# Patient Record
Sex: Female | Born: 1985 | Race: White | Hispanic: No | Marital: Married | Smoking: Never smoker
Health system: Southern US, Community
[De-identification: ages and names within clinical notes are randomized; demographics above are authoritative.]

---

## 2014-11-12 ENCOUNTER — Emergency Department (HOSPITAL_BASED_OUTPATIENT_CLINIC_OR_DEPARTMENT_OTHER): Payer: BLUE CROSS/BLUE SHIELD

## 2014-11-12 ENCOUNTER — Encounter (HOSPITAL_BASED_OUTPATIENT_CLINIC_OR_DEPARTMENT_OTHER): Payer: Self-pay | Admitting: Emergency Medicine

## 2014-11-12 ENCOUNTER — Emergency Department (HOSPITAL_BASED_OUTPATIENT_CLINIC_OR_DEPARTMENT_OTHER)
Admission: EM | Admit: 2014-11-12 | Discharge: 2014-11-12 | Disposition: A | Discharge status: Home / Self Care | Payer: BLUE CROSS/BLUE SHIELD | Attending: Emergency Medicine | Admitting: Emergency Medicine

## 2014-11-12 DIAGNOSIS — IMO0002 Reserved for concepts with insufficient information to code with codable children: Secondary | ICD-10-CM

## 2014-11-12 DIAGNOSIS — W260XXA Contact with knife, initial encounter: Secondary | ICD-10-CM | POA: Diagnosis not present

## 2014-11-12 DIAGNOSIS — Y93G3 Activity, cooking and baking: Secondary | ICD-10-CM | POA: Insufficient documentation

## 2014-11-12 DIAGNOSIS — Y998 Other external cause status: Secondary | ICD-10-CM | POA: Insufficient documentation

## 2014-11-12 DIAGNOSIS — Y9289 Other specified places as the place of occurrence of the external cause: Secondary | ICD-10-CM | POA: Insufficient documentation

## 2014-11-12 DIAGNOSIS — S61012A Laceration without foreign body of left thumb without damage to nail, initial encounter: Secondary | ICD-10-CM | POA: Diagnosis present

## 2014-11-12 DIAGNOSIS — Z23 Encounter for immunization: Secondary | ICD-10-CM | POA: Insufficient documentation

## 2014-11-12 MED ORDER — LIDOCAINE HCL (PF) 1 % IJ SOLN
5.0000 mL | Freq: Once | INTRAMUSCULAR | Status: AC
Start: 2014-11-12 — End: 2014-11-12
  Administered 2014-11-12: 5 mL
  Filled 2014-11-12: qty 5

## 2014-11-12 MED ORDER — HYDROCODONE-ACETAMINOPHEN 5-325 MG PO TABS
1.0000 | ORAL_TABLET | Freq: Once | ORAL | Status: AC
Start: 2014-11-12 — End: 2014-11-12
  Administered 2014-11-12: 1 via ORAL
  Filled 2014-11-12: qty 1

## 2014-11-12 MED ORDER — TETANUS-DIPHTH-ACELL PERTUSSIS 5-2.5-18.5 LF-MCG/0.5 IM SUSP
0.5000 mL | Freq: Once | INTRAMUSCULAR | Status: AC
  Administered 2014-11-12: 0.5 mL via INTRAMUSCULAR
  Filled 2014-11-12: qty 0.5

## 2014-11-12 NOTE — Discharge Instructions (Signed)

## 2014-11-12 NOTE — ED Notes (Addendum)
Pt received new knives for wedding present, first time using them and sliced left  thumb while cooking dinner

## 2014-11-12 NOTE — ED Provider Notes (Signed)
CSN: [id number]     Arrival date & time 11/12/14  2002 History   First MD Initiated Contact with Patient 11/12/14 2018     Chief Complaint  Patient presents with  . Laceration     (Consider location/radiation/quality/duration/timing/severity/associated sxs/prior Treatment) Patient is a 29 y.o. female presenting with skin laceration. The history is provided by the patient. No language interpreter was used.  Laceration Location:  Hand Hand laceration location:  L finger Laceration mechanism:  Knife Pain details:    Quality:  Aching   Severity:  Moderate   Timing:  Constant   Progression:  Unchanged Foreign body present:  No foreign bodies Worsened by:  Nothing tried Tetanus status:  Out of date   History reviewed. No pertinent past medical history. History reviewed. No pertinent past surgical history. History reviewed. No pertinent family history. History  Substance Use Topics  . Smoking status: Never Smoker   . Smokeless tobacco: Not on file  . Alcohol Use: No   OB History    No data available     Review of Systems  All other systems reviewed and are negative.     Allergies  Review of patient's allergies indicates no known allergies.  Home Medications   Prior to Admission medications   Not on File   BP 131/91 mmHg  Pulse 84  Temp(Src) 98.5 F (36.9 C) (Oral)  Resp 18  Ht 5\' 4"  (1.626 m)  Wt 135 lb (61.236 kg)  BMI 23.16 kg/m2  SpO2 100% Physical Exam  Constitutional: She is oriented to person, place, and time. She appears well-developed and well-nourished.  Cardiovascular: Normal rate and regular rhythm.   Pulmonary/Chest: Effort normal and breath sounds normal.  Musculoskeletal: Normal range of motion.  Neurological: She is alert and oriented to person, place, and time. Coordination normal.  Skin:  Laceration to the left thumb partially thru the nail  Nursing note and vitals reviewed.   ED Course  LACERATION REPAIR Date/Time: 11/12/2014 9:48  PM Performed by: Teressa LowerPICKERING, Jesiah Yerby Authorized by: Teressa LowerPICKERING, Verline Kong Consent: Verbal consent obtained. Consent given by: patient Patient identity confirmed: verbally with patient Time out: Immediately prior to procedure a "time out" was called to verify the correct patient, procedure, equipment, support staff and site/side marked as required. Body area: upper extremity Location details: left thumb Laceration length: 1 cm Foreign bodies: no foreign bodies Anesthesia: local infiltration Local anesthetic: lidocaine 1% without epinephrine Irrigation solution: saline and tap water Amount of cleaning: standard Skin closure: 4-0 Prolene Number of sutures: 4 Technique: simple Approximation: close Approximation difficulty: simple Patient tolerance: Patient tolerated the procedure well with no immediate complications   (including critical care time) Labs Review Labs Reviewed - No data to display  Imaging Review Dg Finger Thumb Left  11/12/2014   CLINICAL DATA:  Laceration tip of LEFT thumb this evening.  EXAM: LEFT THUMB 2+V  COMPARISON:  None.  FINDINGS: There is no evidence of fracture or dislocation. There is no evidence of arthropathy or other focal bone abnormality. Distal phalanx soft tissue defect without subcutaneous gas or radiopaque foreign bodies.  IMPRESSION: Distal phalanx suspected soft tissue laceration without acute osseous process.   Electronically Signed   By: Awilda Metroourtnay  Bloomer   On: 11/12/2014 21:05     EKG Interpretation None      MDM   Final diagnoses:  Laceration  Thumb laceration, left, initial encounter    Wound closed without any problem. Tetanus updated. Discussed follow up with DR. Amanda Peagramig for possible nail  revision    Teressa Lower, NP 11/12/14 1610  Rolland Porter, MD 11/16/14 [phone]

## 2014-12-19 ENCOUNTER — Other Ambulatory Visit: Payer: Self-pay | Admitting: Obstetrics & Gynecology

## 2014-12-19 ENCOUNTER — Other Ambulatory Visit (HOSPITAL_COMMUNITY)
Admission: RE | Admit: 2014-12-19 | Discharge: 2014-12-19 | Disposition: A | Discharge status: Home / Self Care | Payer: BLUE CROSS/BLUE SHIELD | Source: Ambulatory Visit | Attending: Obstetrics and Gynecology | Admitting: Obstetrics and Gynecology

## 2014-12-19 DIAGNOSIS — Z01411 Encounter for gynecological examination (general) (routine) with abnormal findings: Secondary | ICD-10-CM | POA: Insufficient documentation

## 2014-12-20 LAB — CYTOLOGY - PAP

## 2016-10-11 IMAGING — CR DG FINGER THUMB 2+V*L*
3 series · 3 of 3 positions shown · non-contrast
Comparison: None.

CLINICAL DATA: Laceration tip of LEFT thumb this evening.

EXAM:
LEFT THUMB 2+V

[x finger pa left]
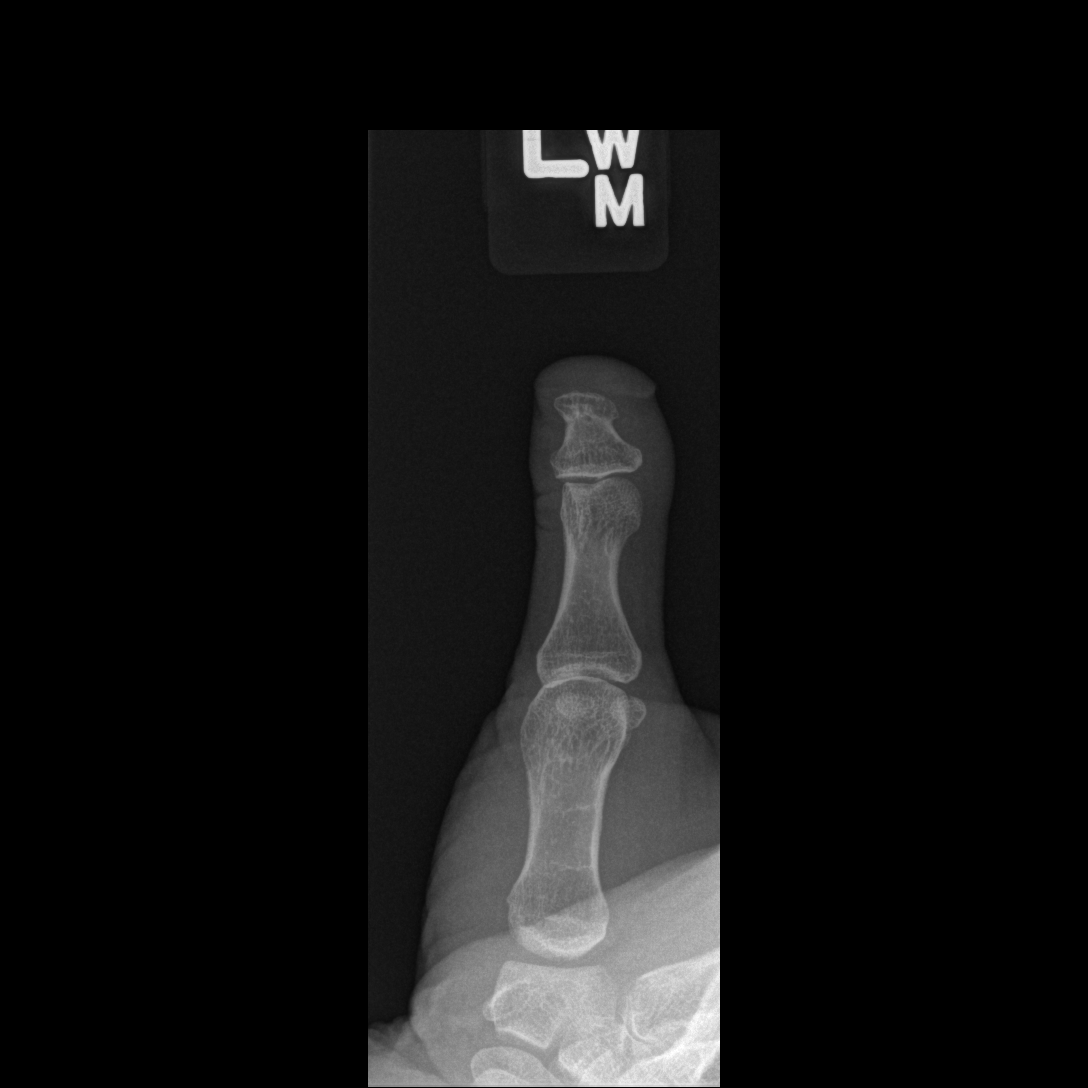

[x finger obl. left]
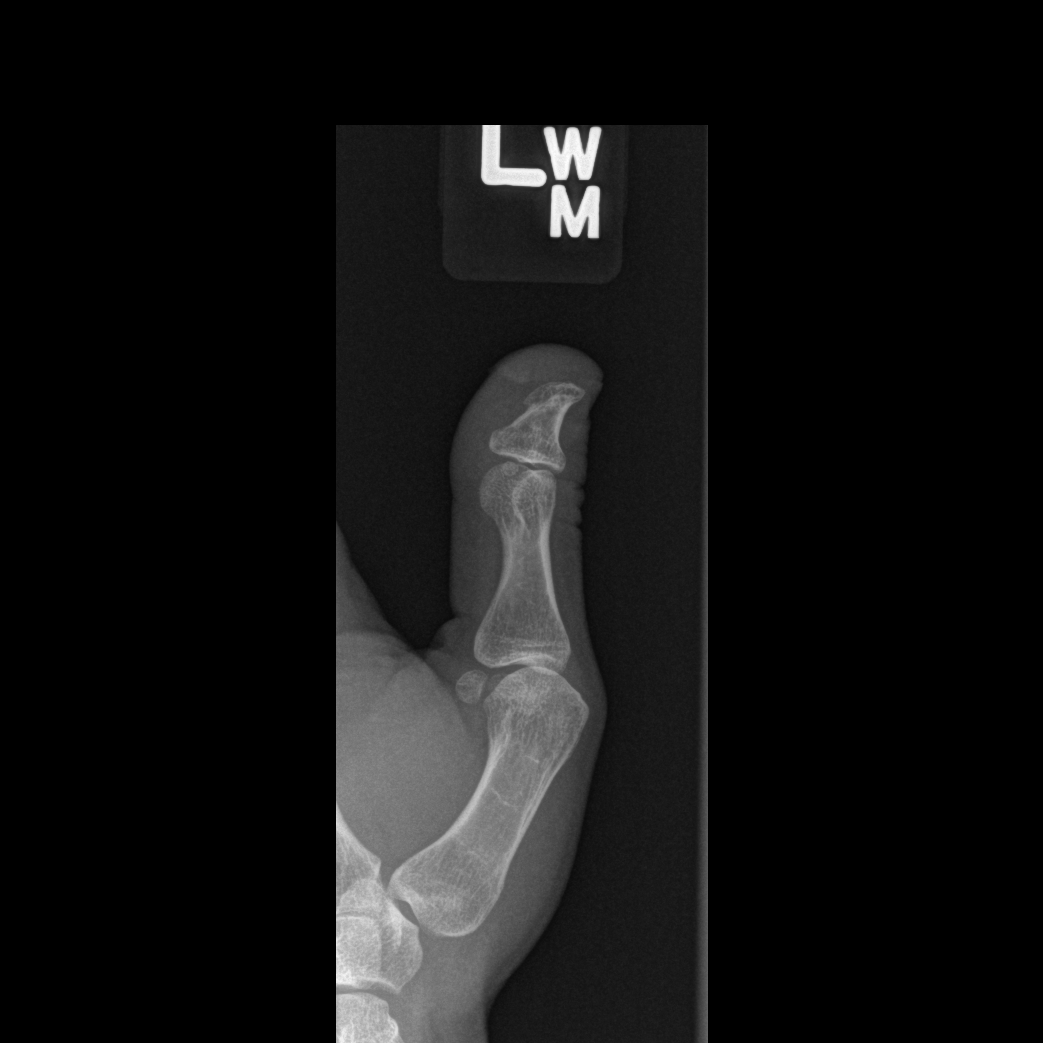

[x finger lateral left]
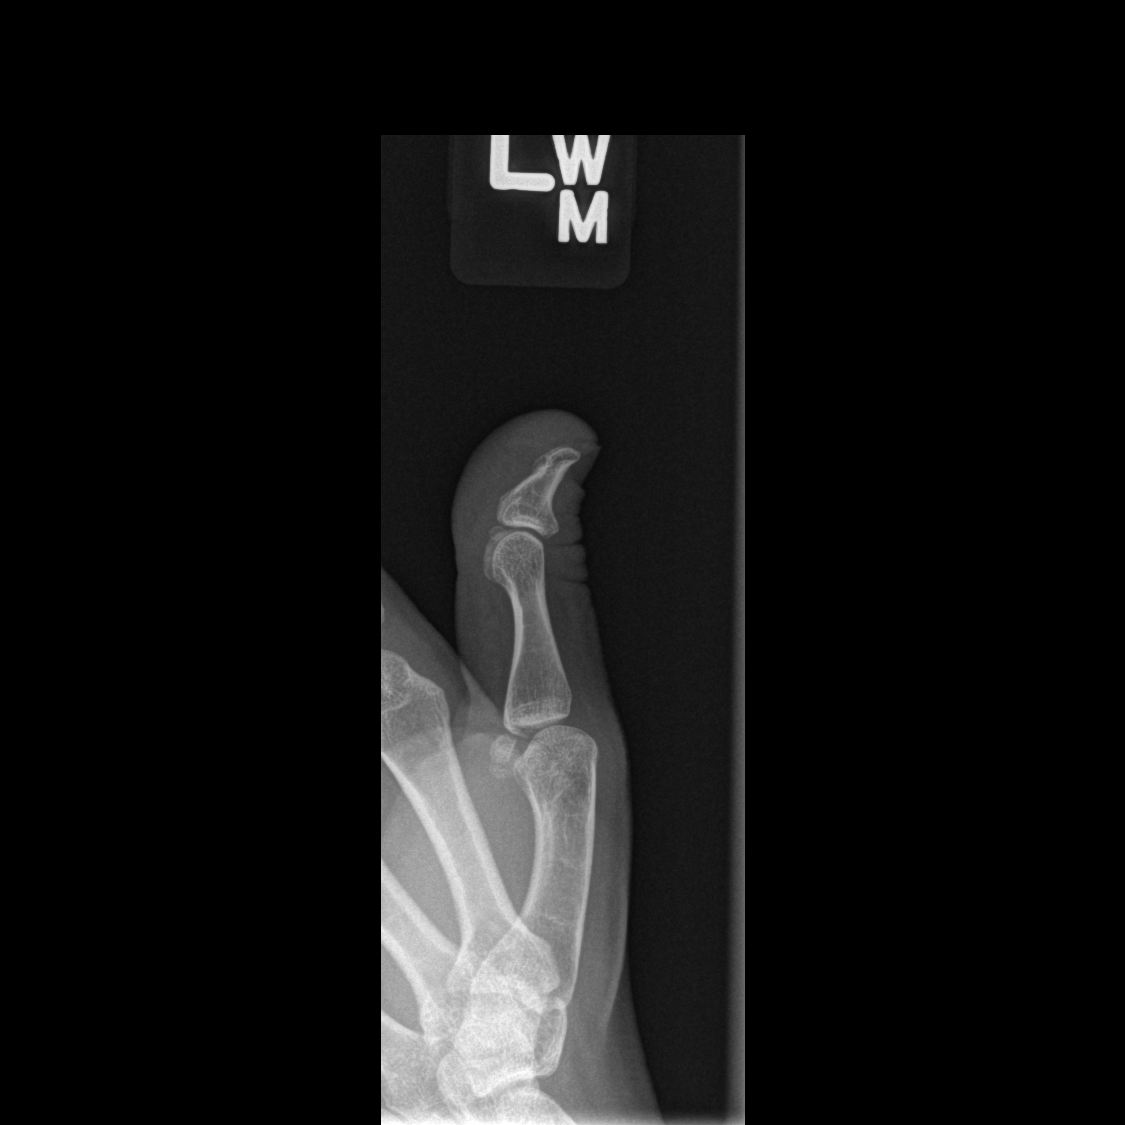

[3 of 3 positions shown; findings below may reference images not displayed]

FINDINGS: There is no evidence of fracture or dislocation. There is no
evidence of arthropathy or other focal bone abnormality. Distal
phalanx soft tissue defect without subcutaneous gas or radiopaque
foreign bodies.
IMPRESSION: Distal phalanx suspected soft tissue laceration without acute
osseous process.

  By: Lusia Tomaka
